# Patient Record
Sex: Female | Born: 1995 | Hispanic: Yes | Marital: Single | Smoking: Never smoker
Health system: Southern US, Community
[De-identification: ages and names within clinical notes are randomized; demographics above are authoritative.]

## PROBLEM LIST (undated history)

## (undated) DIAGNOSIS — D6861 Antiphospholipid syndrome: Secondary | ICD-10-CM

## (undated) DIAGNOSIS — Z789 Other specified health status: Secondary | ICD-10-CM

## (undated) HISTORY — PX: NO PAST SURGERIES: SHX2092

---

## 2016-10-01 ENCOUNTER — Emergency Department (HOSPITAL_BASED_OUTPATIENT_CLINIC_OR_DEPARTMENT_OTHER): Payer: Medicaid Other

## 2016-10-01 ENCOUNTER — Emergency Department (HOSPITAL_BASED_OUTPATIENT_CLINIC_OR_DEPARTMENT_OTHER)
Admission: EM | Admit: 2016-10-01 | Discharge: 2016-10-01 | Disposition: A | Discharge status: Home / Self Care | Payer: Medicaid Other | Attending: Emergency Medicine | Admitting: Emergency Medicine

## 2016-10-01 ENCOUNTER — Encounter (HOSPITAL_BASED_OUTPATIENT_CLINIC_OR_DEPARTMENT_OTHER): Payer: Self-pay | Admitting: Emergency Medicine

## 2016-10-01 DIAGNOSIS — Z79899 Other long term (current) drug therapy: Secondary | ICD-10-CM | POA: Insufficient documentation

## 2016-10-01 DIAGNOSIS — R102 Pelvic and perineal pain: Secondary | ICD-10-CM | POA: Diagnosis not present

## 2016-10-01 DIAGNOSIS — O4690 Antepartum hemorrhage, unspecified, unspecified trimester: Secondary | ICD-10-CM

## 2016-10-01 DIAGNOSIS — Z3A01 Less than 8 weeks gestation of pregnancy: Secondary | ICD-10-CM

## 2016-10-01 DIAGNOSIS — O209 Hemorrhage in early pregnancy, unspecified: Secondary | ICD-10-CM | POA: Insufficient documentation

## 2016-10-01 LAB — CBC WITH DIFFERENTIAL/PLATELET
Basophils Absolute: 0 10*3/uL (ref 0.0–0.1)
Basophils Relative: 0 %
Eosinophils Absolute: 0 10*3/uL (ref 0.0–0.7)
Eosinophils Relative: 0 %
HEMATOCRIT: 37 % (ref 36.0–46.0)
Hemoglobin: 12.1 g/dL (ref 12.0–15.0)
LYMPHS PCT: 28 %
Lymphs Abs: 2 10*3/uL (ref 0.7–4.0)
MCH: 27.6 pg (ref 26.0–34.0)
MCHC: 32.7 g/dL (ref 30.0–36.0)
MCV: 84.5 fL (ref 78.0–100.0)
MONO ABS: 0.6 10*3/uL (ref 0.1–1.0)
MONOS PCT: 9 %
NEUTROS ABS: 4.3 10*3/uL (ref 1.7–7.7)
Neutrophils Relative %: 63 %
Platelets: 201 10*3/uL (ref 150–400)
RBC: 4.38 MIL/uL (ref 3.87–5.11)
RDW: 12.4 % (ref 11.5–15.5)
WBC: 7 10*3/uL (ref 4.0–10.5)

## 2016-10-01 LAB — HCG, QUANTITATIVE, PREGNANCY: hCG, Beta Chain, Quant, S: 30987 m[IU]/mL — ABNORMAL HIGH (ref <5)

## 2016-10-01 LAB — URINALYSIS, ROUTINE W REFLEX MICROSCOPIC
Bilirubin Urine: NEGATIVE
GLUCOSE, UA: NEGATIVE mg/dL
HGB URINE DIPSTICK: NEGATIVE
Ketones, ur: NEGATIVE mg/dL
Leukocytes, UA: NEGATIVE
Nitrite: NEGATIVE
PH: 6.5 (ref 5.0–8.0)
Protein, ur: NEGATIVE mg/dL
SPECIFIC GRAVITY, URINE: 1.021 (ref 1.005–1.030)

## 2016-10-01 LAB — BASIC METABOLIC PANEL WITH GFR
Anion gap: 8 (ref 5–15)
BUN: 11 mg/dL (ref 6–20)
CO2: 24 mmol/L (ref 22–32)
Calcium: 9.6 mg/dL (ref 8.9–10.3)
Chloride: 103 mmol/L (ref 101–111)
Creatinine, Ser: 0.58 mg/dL (ref 0.44–1.00)
GFR calc Af Amer: >60 mL/min
GFR calc non Af Amer: >60 mL/min
Glucose, Bld: 82 mg/dL (ref 65–99)
Potassium: 3.5 mmol/L (ref 3.5–5.1)
Sodium: 135 mmol/L (ref 135–145)

## 2016-10-01 LAB — WET PREP, GENITAL
Clue Cells Wet Prep HPF POC: NONE SEEN
Sperm: NONE SEEN
Trich, Wet Prep: NONE SEEN
Yeast Wet Prep HPF POC: NONE SEEN

## 2016-10-01 NOTE — ED Triage Notes (Signed)
Pt pregnant [redacted] weeks and having vaginal spotting.  States intermittent mild abdominal pains.  Pt notices bleeding only when wiping after urination and states it's been worsening slightly every day.  First noticed 4 days ago.

## 2016-10-01 NOTE — ED Notes (Signed)
Pt given d/c instructions per Dr in [place]. Verbalizes understanding. No questions.

## 2016-10-01 NOTE — Discharge Instructions (Signed)
Ultrasound results: IMPRESSION:  Single living IUP measuring 6 weeks 1 day with US EDC of 05/26/2017.     Tiny subchorionic hemorrhage or implantation bleed noted.   Discuss this with your OB doctor for follow up.

## 2016-10-01 NOTE — ED Notes (Signed)
Patient transported to Ultrasound 

## 2016-10-01 NOTE — ED Notes (Addendum)
Patient P1G0, with light vaginal bleeding x4 days when she urinates. Patient states at the same time that she started having the bleeding she also started having intermittent pelvic cramping. Patient states the pain and the bleeding started at the same time. Patient is A&Ox4, NAD noted. Patient denies any PMH. Patient has not yet been seen by OB-GYN, states her first appointment is on Nov 03, 2016.

## 2016-10-01 NOTE — ED Provider Notes (Signed)
[facility] Provider Note   CSN: [id number] Arrival date & time: 10/01/16  1428  By signing my name below, I, Terri Rice, attest that this documentation has been prepared under the direction and in the presence of Terri ConnPedro Eduardo Cardama, MD. Electronically Signed: Doreatha MartinEva Rice, ED Scribe. 10/01/16. 4:12 PM.     History   Chief Complaint Chief Complaint  Patient presents with  . Vaginal Bleeding    [redacted] weeks pregnant    HPI Terri Rice is a 20 y.o. female G1P0000 5w who presents to the Emergency Department complaining of worsening, light vaginal discharge x 4 days with associated intermittent lower abdominal cramping that lasts 3-4 minutes every 3-4 hours. Pt states she is currently pain-free. Pt describes the discharge as light brown and reports she feels the discharge every time she voids. LMP 08/27/16. She had confirmed urine pregnancy test 4 days ago at home and [facility]. She denies fever, nausea, vomiting, CP, SOB, dysuria, diarrhea, purulent discharge. No h/o STD.   The history is provided by the patient. No language interpreter was used.    History reviewed. No pertinent past medical history.  There are no active problems to display for this patient.   History reviewed. No pertinent surgical history.  OB History    Gravida Para Term Preterm AB Living   1             SAB TAB Ectopic Multiple Live Births                   Home Medications    Prior to Admission medications   Medication Sig Start Date End Date Taking? Authorizing Provider  Prenatal Vit-Fe Fumarate-FA (PRENATAL MULTIVITAMIN) TABS tablet Take 1 tablet by mouth daily at 12 noon.   Yes Historical Provider, MD    Family History No family history on file.  Social History Social History  Substance Use Topics  . Smoking status: Never Smoker  . Smokeless tobacco: Never Used  . Alcohol use Not on file     Allergies   Patient has no known allergies.   Review of  Systems Review of Systems A complete 10 system review of systems was obtained and all systems are negative except as noted in the HPI and PMH.    Physical Exam Updated Vital Signs BP 118/80 (BP Location: Right Arm)   Pulse 88   Temp 98.4 F (36.9 C) (Oral)   Resp 18   Ht 5\' 3"  (1.6 m)   SpO2 100%   Physical Exam  Constitutional: She is oriented to person, place, and time. She appears well-developed and well-nourished. No distress.  HENT:  Head: Normocephalic and atraumatic.  Nose: Nose normal.  Eyes: Conjunctivae and EOM are normal. Pupils are equal, round, and reactive to light. Right eye exhibits no discharge. Left eye exhibits no discharge. No scleral icterus.  Neck: Normal range of motion. Neck supple.  Cardiovascular: Normal rate, regular rhythm and normal heart sounds.  Exam reveals no gallop and no friction rub.   No murmur heard. Pulmonary/Chest: Effort normal and breath sounds normal. No stridor. No respiratory distress. She has no wheezes. She has no rales.  Abdominal: Soft. Bowel sounds are normal. She exhibits no distension. There is no tenderness.  Genitourinary: Rectal exam shows no external hemorrhoid. Pelvic exam was performed with patient supine. Uterus is enlarged. Uterus is not deviated and not tender. Cervix exhibits no motion tenderness, no discharge and no friability. Right adnexum  displays no mass and no tenderness. Left adnexum displays no mass and no tenderness. No erythema, tenderness or bleeding in the vagina. No foreign body in the vagina. No signs of injury around the vagina. Vaginal discharge (thick brownish-white) found.  Genitourinary Comments: Engorged cervix. Chaperone present throughout entire exam.   Musculoskeletal: She exhibits no edema or tenderness.  Neurological: She is alert and oriented to person, place, and time.  Skin: Skin is warm and dry. No rash noted. She is not diaphoretic. No erythema.  Psychiatric: She has a normal mood and affect.    Vitals reviewed.    ED Treatments / Results   DIAGNOSTIC STUDIES: Oxygen Saturation is 100% on RA, normal by my interpretation.    COORDINATION OF CARE: 4:10 PM Discussed treatment plan with pt at bedside which includes UA, urine preg and pt agreed to plan.    Labs (all labs ordered are listed, but only abnormal results are displayed) Labs Reviewed  WET PREP, GENITAL - Abnormal; Notable for the following:       Result Value   WBC, Wet Prep HPF POC MODERATE (*)    All other components within normal limits  HCG, QUANTITATIVE, PREGNANCY - Abnormal; Notable for the following:    hCG, Beta Chain, Quant, S 30,987 (*)    All other components within normal limits  URINALYSIS, ROUTINE W REFLEX MICROSCOPIC (NOT AT [facility])  CBC WITH DIFFERENTIAL/PLATELET  BASIC METABOLIC PANEL  ABO/RH  GC/CHLAMYDIA PROBE AMP ([place]) NOT AT [facility]    EKG  EKG Interpretation None       Radiology [place] Ob Comp < 14 Wks  Result Date: 10/01/2016 CLINICAL DATA:  Pelvic pain cramping and vaginal bleeding for 4 days. Gestational age by LMP of 5 weeks 0 days. EXAM: OBSTETRIC <14 WK US AND TRANSVAGINAL OB US TECHNIQUE: Both transabdominal and transvaginal ultrasound examinations were performed for complete evaluation of the gestation as well as the maternal uterus, adnexal regions, and pelvic cul-de-sac. Transvaginal technique was performed to assess early pregnancy. COMPARISON:  None. FINDINGS: Intrauterine gestational sac: Single Yolk sac:  Visualized. Embryo:  Visualized. Cardiac Activity: Visualized. Heart Rate: 114  bpm CRL:  4  mm   6 w   1 d                  US EDC: 05/26/2017 Subchorionic hemorrhage: Tiny subchorionic hemorrhage or implantation bleed noted. Maternal uterus/adnexae: Retroverted uterus. Small left ovarian corpus luteum noted. Otherwise normal appearance of both ovaries. No masses or abnormal free fluid identified. IMPRESSION: Single living IUP measuring 6 weeks 1 day with US EDC of  05/26/2017. Tiny subchorionic hemorrhage or implantation bleed noted. Electronically Signed   By: Myles RosenthalJohn  Stahl M.D.   On: 10/01/2016 18:07   [place] Ob Transvaginal  Result Date: 10/01/2016 CLINICAL DATA:  Pelvic pain cramping and vaginal bleeding for 4 days. Gestational age by LMP of 5 weeks 0 days. EXAM: OBSTETRIC <14 WK US AND TRANSVAGINAL OB US TECHNIQUE: Both transabdominal and transvaginal ultrasound examinations were performed for complete evaluation of the gestation as well as the maternal uterus, adnexal regions, and pelvic cul-de-sac. Transvaginal technique was performed to assess early pregnancy. COMPARISON:  None. FINDINGS: Intrauterine gestational sac: Single Yolk sac:  Visualized. Embryo:  Visualized. Cardiac Activity: Visualized. Heart Rate: 114  bpm CRL:  4  mm   6 w   1 d                  US EDC: 05/26/2017 Subchorionic  hemorrhage: Tiny subchorionic hemorrhage or implantation bleed noted. Maternal uterus/adnexae: Retroverted uterus. Small left ovarian corpus luteum noted. Otherwise normal appearance of both ovaries. No masses or abnormal free fluid identified. IMPRESSION: Single living IUP measuring 6 weeks 1 day with [place] EDC of 05/26/2017. Tiny subchorionic hemorrhage or implantation bleed noted. Electronically Signed   By: Myles Rosenthal M.D.   On: 10/01/2016 18:07    Procedures Procedures (including critical care time)  Medications Ordered in ED Medications - No data to display   Initial Impression / Assessment and Plan / ED Course  I have reviewed the triage vital signs and the nursing notes.  Pertinent labs & imaging results that were available during my care of the patient were reviewed by me and considered in my medical decision making (see chart for details).  Clinical Course as of Oct 01 1816  Wynelle Link Oct 01, 2016  1812 Rh+. No evidence of cervicitis. No trich or BV. No UTI. [place] with IUP at 6w 1d. Implantation bleed vs subchorionic hemorrahge. Pt made aware of findings. OB follow  up.  The patient is safe for discharge with strict return precautions.   [PC]    Clinical Course User Index [PC] Terri Conn, MD      Final Clinical Impressions(s) / ED Diagnoses   Final diagnoses:  Vaginal bleeding in pregnancy  Less than [redacted] weeks gestation of pregnancy   Disposition: Discharge  Condition: Good  I have discussed the results, Dx and Tx plan with the patient who expressed understanding and agree(s) with the plan. Discharge instructions discussed at great length. The patient was given strict return precautions who verbalized understanding of the instructions. No further questions at time of discharge.    Current Discharge Medication List      Follow Up: Obstetrician  Schedule an appointment as soon as possible for a visit    I personally performed the services described in this documentation, which was scribed in my presence. The recorded information has been reviewed and is accurate.         Terri Conn, MD 10/01/16 [phone]

## 2016-10-01 NOTE — ED Notes (Signed)
US notified patient is ready for imaging

## 2016-10-01 NOTE — ED Notes (Signed)
Pelvic Cart at bedside 

## 2016-10-02 LAB — ABO/RH: ABO/RH(D): O POS

## 2016-10-03 LAB — GC/CHLAMYDIA PROBE AMP (~~LOC~~) NOT AT ARMC
Chlamydia: NEGATIVE
NEISSERIA GONORRHEA: NEGATIVE

## 2017-02-04 ENCOUNTER — Encounter (HOSPITAL_BASED_OUTPATIENT_CLINIC_OR_DEPARTMENT_OTHER): Payer: Self-pay | Admitting: *Deleted

## 2017-02-04 ENCOUNTER — Emergency Department (HOSPITAL_BASED_OUTPATIENT_CLINIC_OR_DEPARTMENT_OTHER)
Admission: EM | Admit: 2017-02-04 | Discharge: 2017-02-04 | Disposition: A | Discharge status: Home / Self Care | Payer: Medicaid Other | Attending: Emergency Medicine | Admitting: Emergency Medicine

## 2017-02-04 DIAGNOSIS — Z79899 Other long term (current) drug therapy: Secondary | ICD-10-CM | POA: Insufficient documentation

## 2017-02-04 DIAGNOSIS — O2692 Pregnancy related conditions, unspecified, second trimester: Secondary | ICD-10-CM

## 2017-02-04 DIAGNOSIS — E860 Dehydration: Secondary | ICD-10-CM | POA: Diagnosis not present

## 2017-02-04 DIAGNOSIS — R42 Dizziness and giddiness: Secondary | ICD-10-CM

## 2017-02-04 DIAGNOSIS — O99282 Endocrine, nutritional and metabolic diseases complicating pregnancy, second trimester: Secondary | ICD-10-CM | POA: Diagnosis not present

## 2017-02-04 DIAGNOSIS — R51 Headache: Secondary | ICD-10-CM | POA: Insufficient documentation

## 2017-02-04 DIAGNOSIS — O26892 Other specified pregnancy related conditions, second trimester: Secondary | ICD-10-CM | POA: Diagnosis present

## 2017-02-04 DIAGNOSIS — R079 Chest pain, unspecified: Secondary | ICD-10-CM | POA: Diagnosis not present

## 2017-02-04 DIAGNOSIS — R002 Palpitations: Secondary | ICD-10-CM | POA: Insufficient documentation

## 2017-02-04 DIAGNOSIS — Z3A24 24 weeks gestation of pregnancy: Secondary | ICD-10-CM | POA: Insufficient documentation

## 2017-02-04 LAB — CBC WITH DIFFERENTIAL/PLATELET
Basophils Absolute: 0 10*3/uL (ref 0.0–0.1)
Basophils Relative: 0 %
Eosinophils Absolute: 0 10*3/uL (ref 0.0–0.7)
Eosinophils Relative: 0 %
HEMATOCRIT: 30.9 % — AB (ref 36.0–46.0)
Hemoglobin: 10.1 g/dL — ABNORMAL LOW (ref 12.0–15.0)
LYMPHS ABS: 1.3 10*3/uL (ref 0.7–4.0)
LYMPHS PCT: 11 %
MCH: 28.1 pg (ref 26.0–34.0)
MCHC: 32.7 g/dL (ref 30.0–36.0)
MCV: 86.1 fL (ref 78.0–100.0)
MONOS PCT: 6 %
Monocytes Absolute: 0.7 10*3/uL (ref 0.1–1.0)
NEUTROS ABS: 9.3 10*3/uL — AB (ref 1.7–7.7)
Neutrophils Relative %: 83 %
Platelets: 166 10*3/uL (ref 150–400)
RBC: 3.59 MIL/uL — ABNORMAL LOW (ref 3.87–5.11)
RDW: 12.8 % (ref 11.5–15.5)
WBC: 11.3 10*3/uL — ABNORMAL HIGH (ref 4.0–10.5)

## 2017-02-04 LAB — URINALYSIS, ROUTINE W REFLEX MICROSCOPIC
BILIRUBIN URINE: NEGATIVE
GLUCOSE, UA: NEGATIVE mg/dL
Hgb urine dipstick: NEGATIVE
KETONES UR: NEGATIVE mg/dL
Nitrite: NEGATIVE
PH: 6.5 (ref 5.0–8.0)
PROTEIN: NEGATIVE mg/dL
SPECIFIC GRAVITY, URINE: 1.016 (ref 1.005–1.030)

## 2017-02-04 LAB — URINALYSIS, MICROSCOPIC (REFLEX)

## 2017-02-04 LAB — BASIC METABOLIC PANEL
ANION GAP: 8 (ref 5–15)
BUN: 6 mg/dL (ref 6–20)
CHLORIDE: 103 mmol/L (ref 101–111)
CO2: 22 mmol/L (ref 22–32)
Calcium: 9.2 mg/dL (ref 8.9–10.3)
Creatinine, Ser: 0.49 mg/dL (ref 0.44–1.00)
GFR calc Af Amer: >60 mL/min (ref >60)
GFR calc non Af Amer: >60 mL/min (ref >60)
GLUCOSE: 81 mg/dL (ref 65–99)
POTASSIUM: 3.7 mmol/L (ref 3.5–5.1)
Sodium: 133 mmol/L — ABNORMAL LOW (ref 135–145)

## 2017-02-04 MED ORDER — SODIUM CHLORIDE 0.9 % IV BOLUS (SEPSIS)
1000.0000 mL | Freq: Once | INTRAVENOUS | Status: AC
  Administered 2017-02-04: 1000 mL via INTRAVENOUS

## 2017-02-04 NOTE — Progress Notes (Signed)
Spoke with Dr. Despina Hidden. Pt is a G1P0 at 24 4/[redacted] weeks gestation presenting with c/o palpatations and dizziness. No vaginal bleeding or leaking of fluid. FHR 145 BPM, mod variability, accels, no decels. No uc's. Dr. Despina Hidden notified of orthostatic blood pressures, labs, and u/a. Pt is getting a liter of IVF. Pt gets her care in [place]. Pt is OB cleared and the [facility] staff is to call the pt's OB for any further concerns.

## 2017-02-04 NOTE — ED Triage Notes (Signed)
Pt reports being approx [redacted] weeks pregnant and has been experiencing dizziness (mostly when going from sitting to standing). Also reports feeling tachycardic at times. Denies fever, abd pain, bleeding/fluid from vagina. Pt reports receiving care in [facility]. Denies any symptoms of dizziness/sob at this time.

## 2017-02-04 NOTE — Progress Notes (Signed)
Spoke with Maury Dus. Pt is OB cleared and they are to call her OB with any further concerns. The pt can be taken off the FM.

## 2017-02-04 NOTE — ED Provider Notes (Signed)
[facility] Provider Note   CSN: [id number] Arrival date & time: 02/04/17  1510  By signing my name below, I, Bing Neighbors., attest that this documentation has been prepared under the direction and in the presence of Rolan Bucco, MD. Electronically signed: Bing Neighbors., ED Scribe. 02/04/17. 4:55 PM.   History   Chief Complaint Chief Complaint  Patient presents with  . Dizziness    HPI  Terri Rice is a 21 y.o. female with no significant medical hx who presents to the Emergency Department complaining of dizziness with onset x1 day. Of note, pt is x24 weeks pregnant. Pt states that while at church earlier today, she stood and experienced an episode dizziness with associated chest pain, palpitations and SOB which lasted x5 minutes. Pt states that the symptoms have since resolved. She denies any modifying factors. Pt denies fever, abdominal pain, vaginal bleeding/discharge, leg pain/swelling, appetite change, difficulty urinating, cough, congestion. She denies any pregnancy complications.   The history is provided by the patient and a relative. No language interpreter was used.    History reviewed. No pertinent past medical history.  There are no active problems to display for this patient.   History reviewed. No pertinent surgical history.  OB History    Gravida Para Term Preterm AB Living   1             SAB TAB Ectopic Multiple Live Births                   Home Medications    Prior to Admission medications   Medication Sig Start Date End Date Taking? Authorizing Provider  folic acid (FOLVITE) 1 MG tablet Take 1 mg by mouth daily.   Yes Historical Provider, MD  Prenatal Vit-Fe Fumarate-FA (PRENATAL MULTIVITAMIN) TABS tablet Take 1 tablet by mouth daily at 12 noon.   Yes Historical Provider, MD  UNKNOWN TO PATIENT    Yes Historical Provider, MD    Family History No family history on file.  Social History Social  History  Substance Use Topics  . Smoking status: Never Smoker  . Smokeless tobacco: Never Used  . Alcohol use No     Allergies   Patient has no known allergies.   Review of Systems Review of Systems  Constitutional: Negative for chills, diaphoresis, fatigue and fever.  HENT: Negative for congestion, rhinorrhea and sneezing.   Eyes: Negative.   Respiratory: Negative for cough, chest tightness and shortness of breath.   Cardiovascular: Positive for chest pain and palpitations. Negative for leg swelling.  Gastrointestinal: Negative for abdominal pain, blood in stool, diarrhea, nausea and vomiting.  Genitourinary: Negative for difficulty urinating, flank pain, frequency and hematuria.  Musculoskeletal: Negative for arthralgias and back pain.  Skin: Negative for rash.  Neurological: Positive for dizziness and headaches. Negative for speech difficulty, weakness and numbness.     Physical Exam Updated Vital Signs BP 115/68 (BP Location: Right Arm)   Pulse 92   Temp 98.4 F (36.9 C) (Oral)   Resp 18   Ht  (1.626 m)   Wt 132 lb 3.2 oz (60 kg)   SpO2 100%   BMI 22.69 kg/m   Physical Exam  Constitutional: She is oriented to person, place, and time. She appears well-developed and well-nourished.  HENT:  Head: Normocephalic and atraumatic.  Eyes: Pupils are equal, round, and reactive to light.  Neck: Normal range of motion. Neck supple.  Cardiovascular: Normal rate, regular rhythm  and normal heart sounds.   Pulmonary/Chest: Effort normal and breath sounds normal. No respiratory distress. She has no wheezes. She has no rales. She exhibits no tenderness.  Abdominal: Soft. Bowel sounds are normal. There is no tenderness. There is no rebound and no guarding.  Fundus is just above the umbilicus.   Musculoskeletal: Normal range of motion. She exhibits no edema.       Right lower leg: She exhibits no tenderness and no edema.       Left lower leg: She exhibits no tenderness and no  edema.  No calf tenderness or edema.   Lymphadenopathy:    She has no cervical adenopathy.  Neurological: She is alert and oriented to person, place, and time.  Motor 5/5 all extremities Sensation grossly intact to LT all extremities Finger to Nose intact, no pronator drift CN II-XII grossly intact Gait normal   Skin: Skin is warm and dry. No rash noted.  Psychiatric: She has a normal mood and affect.     ED Treatments / Results   DIAGNOSTIC STUDIES: Oxygen Saturation is 100% on RA, normal by my interpretation.   COORDINATION OF CARE: 4:55 PM-Discussed next steps with pt. Pt verbalized understanding and is agreeable with the plan.    Labs (all labs ordered are listed, but only abnormal results are displayed) Labs Reviewed  BASIC METABOLIC PANEL - Abnormal; Notable for the following:       Result Value   Sodium 133 (*)    All other components within normal limits  CBC WITH DIFFERENTIAL/PLATELET - Abnormal; Notable for the following:    WBC 11.3 (*)    RBC 3.59 (*)    Hemoglobin 10.1 (*)    HCT 30.9 (*)    Neutro Abs 9.3 (*)    All other components within normal limits  URINALYSIS, ROUTINE W REFLEX MICROSCOPIC - Abnormal; Notable for the following:    APPearance CLOUDY (*)    Leukocytes, UA SMALL (*)    All other components within normal limits  URINALYSIS, MICROSCOPIC (REFLEX) - Abnormal; Notable for the following:    Bacteria, UA FEW (*)    Squamous Epithelial / LPF 0-5 (*)    All other components within normal limits  URINE CULTURE    EKG  EKG Interpretation  Date/Time:  Sunday February 04 2017 15:39:00 EDT Ventricular Rate:  100 PR Interval:    QRS Duration: 82 QT Interval:  328 QTC Calculation: 423 R Axis:   74 Text Interpretation:  Sinus tachycardia Biatrial enlargement RSR' in V1 or V2, probably normal variant Baseline wander in lead(s) V3 V5 V6 No old tracing to compare Confirmed by Renesha Lizama  MD, Nyala Kirchner ([id number]) on 02/04/2017 3:43:18 PM        Radiology No results found.  Procedures Procedures (including critical care time)  Medications Ordered in ED Medications  sodium chloride 0.9 % bolus 1,000 mL (1,000 mLs Intravenous New Bag/Given 02/04/17 1554)     Initial Impression / Assessment and Plan / ED Course  I have reviewed the triage vital signs and the nursing notes.  Pertinent labs & imaging results that were available during my care of the patient were reviewed by me and considered in my medical decision making (see chart for details).     Patient is [redacted] weeks pregnant and presents after she had an episode of palpitations and dizziness while she was standing up for a prolonged period in church. Her symptoms have since resolved. She currently has no chest pain palpitations or  shortness of breath. Her EKG showed some mild tachycardia, sinus rhythm. No dysrhythmias were noted. She's had no arrhythmias in the ED. Her labs are non-concerning. She is able to ambulate without symptoms. She was given IV fluids in the ED. She was placed on the fetal monitor and was cleared by the rapid response. She was encouraged to have close follow-up within the next 1-2 days with her OB/GYN. Return precautions were given.  Final Clinical Impressions(s) / ED Diagnoses   Final diagnoses:  Dizziness  Dehydration  Pregnancy related condition in second trimester    New Prescriptions New Prescriptions   No medications on file   I personally performed the services described in this documentation, which was scribed in my presence.  The recorded information has been reviewed and considered.     Rolan Bucco, MD 02/04/17 [phone]

## 2017-02-04 NOTE — ED Notes (Signed)
Spoke with Hazel Sams, RR RN. She spoke with Dr. Despina Hidden and they are signing off. "Tracing looks great. No contractions. If we have further problems, contact pt's OB/GYN in [place]."

## 2017-02-04 NOTE — ED Notes (Signed)
Pt is [redacted] weeks pregnant. G1P0. Has been having intermittent episodes of dizziness and feeling like her heart is racing for about 3 months. No other s/s. No vaginal bleeding or d/c. Placed on fetal monitror and RR RN  Corrie Dandy Early) notified. EKG obtained and pt placed on cardiac monitor HR 80-100 and regular. Dr. Fredderick Phenix at bedside. IV initiated and labs obtained.

## 2017-02-04 NOTE — Progress Notes (Signed)
Pt is a G1P0 at 24 4/7 weeks gestaion with c/o palpatations and dizziness. Pt gets regular PNC in [place]. Denies vaginal bleeding or leaking of fluid.

## 2017-02-04 NOTE — ED Notes (Signed)
Denys any dizziness when ambulating. Dr at bedside.

## 2017-02-06 LAB — URINE CULTURE: Culture: >=100000 — AB

## 2017-02-07 ENCOUNTER — Telehealth: Payer: Self-pay | Admitting: Emergency Medicine

## 2017-02-07 NOTE — Telephone Encounter (Signed)
Post ED Visit - Positive Culture Follow-up  Culture report reviewed by antimicrobial stewardship pharmacist:   Enzo Bi, Pharm.D.  Celedonio Miyamoto, Pharm.D., BCPS AQ-ID  Garvin Fila, Pharm.D., BCPS  Georgina Pillion, Pharm.D., BCPS  [place], [place].D., BCPS, AAHIVP  Estella Husk, Pharm.D., BCPS, AAHIVP  Lysle Pearl, PharmD, BCPS  Casilda Carls, PharmD, BCPS  Pollyann Samples, PharmD, BCPS Dietrich Pates PharmD  Positive urine culture Treated with none, asymptomatic, likely contaminant, no further patient follow-up is required at this time.  Berle Mull 02/07/2017, 12:47 PM

## 2017-03-12 IMAGING — US US OB COMP LESS 14 WK
1 series · 14 of 28 positions shown · non-contrast
Comparison: None.

CLINICAL DATA: Pelvic pain cramping and vaginal bleeding for 4
days. Gestational age by LMP of 5 weeks 0 days.

EXAM:
OBSTETRIC <14 WK US AND TRANSVAGINAL OB US
TECHNIQUE: Both transabdominal and transvaginal ultrasound examinations were
performed for complete evaluation of the gestation as well as the
maternal uterus, adnexal regions, and pelvic cul-de-sac.
Transvaginal technique was performed to assess early pregnancy.

[Series 1: us ob comp less 14 wk · 0.21mm/px · 14 of 88 slices shown]
[im 4/88]
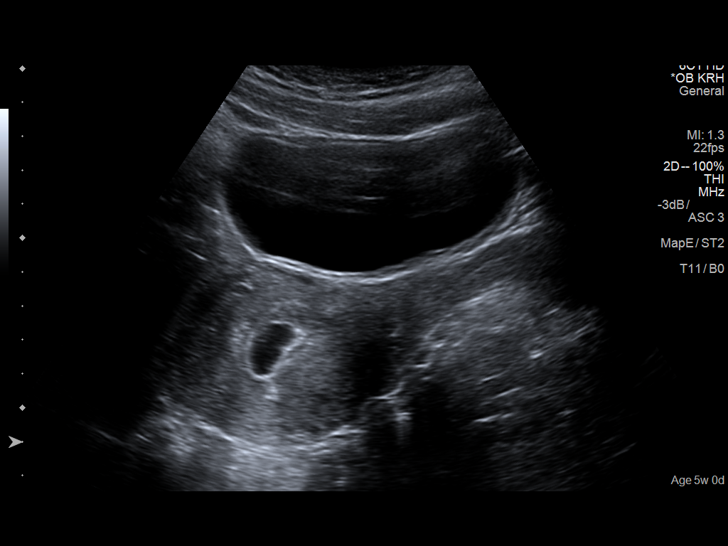
[im 10/88]
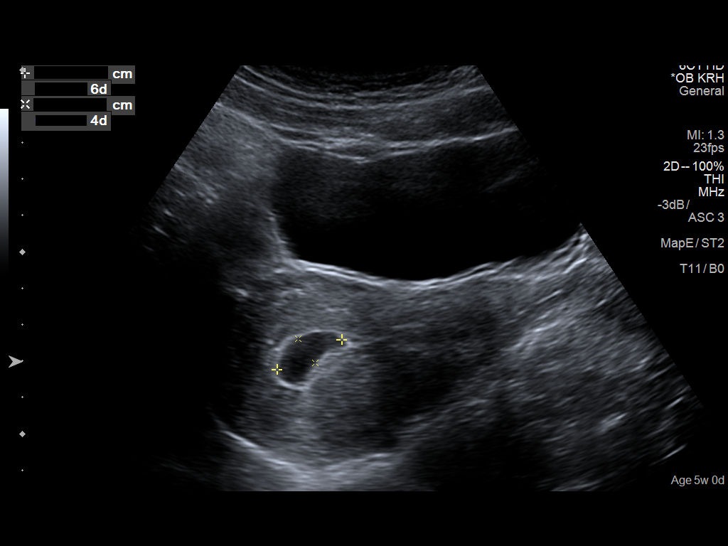
[im 17/88]
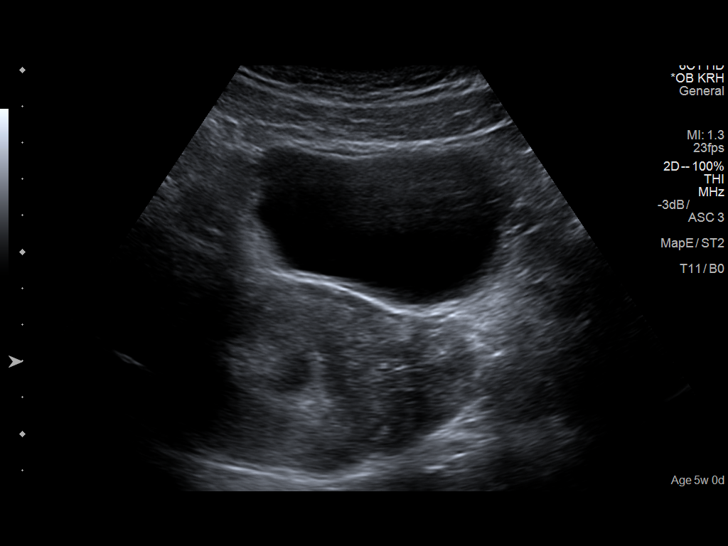
[im 23/88]
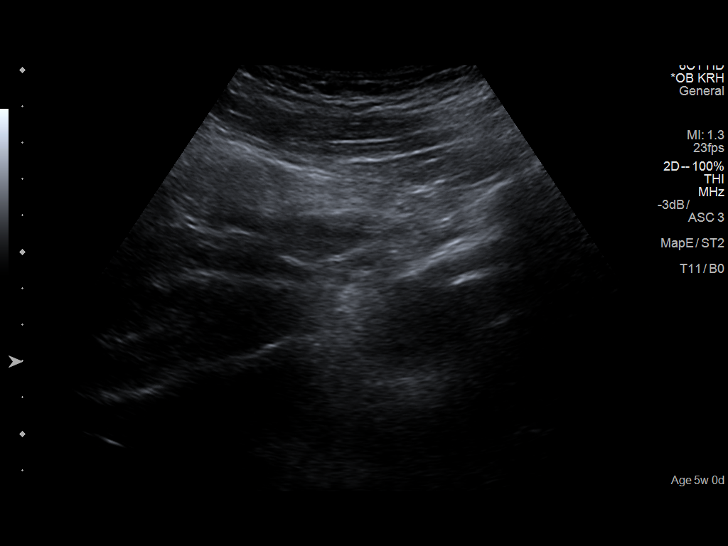
[im 30/88]
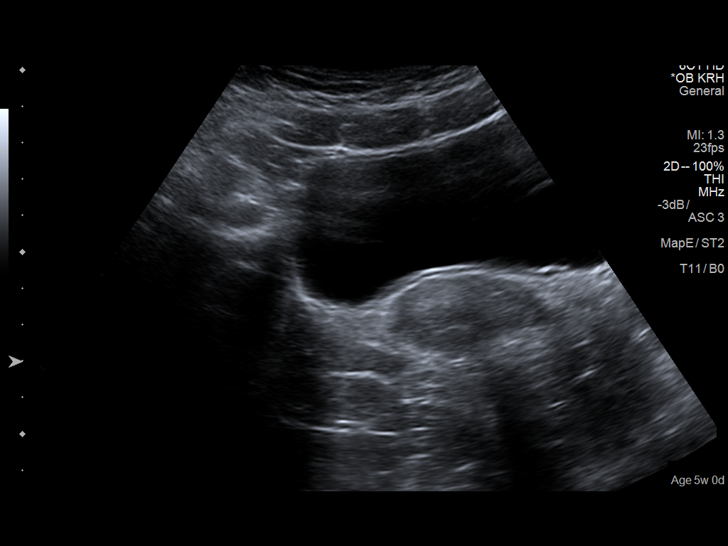
[im 36/88]
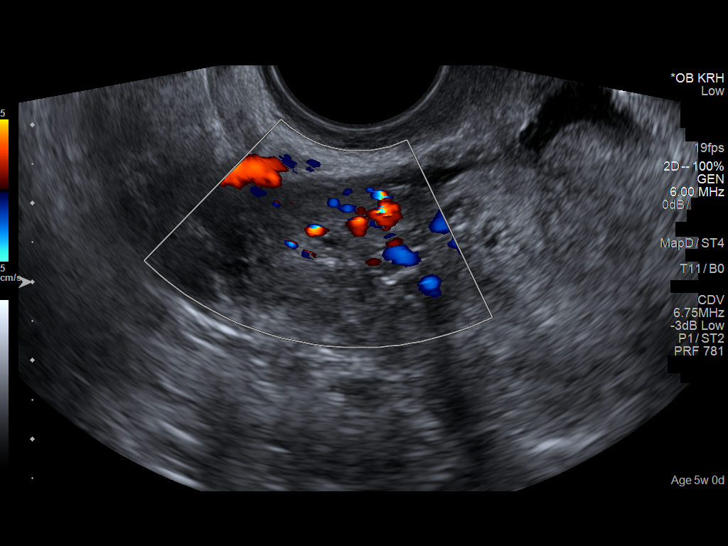
[im 42/88]
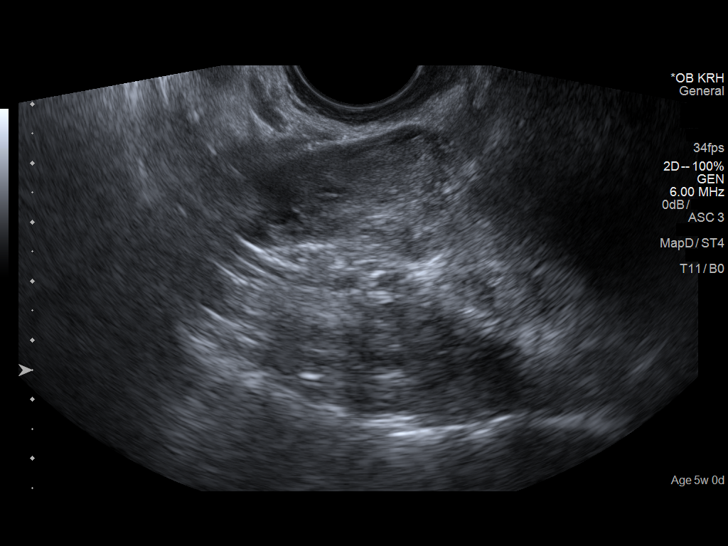
[im 49/88]
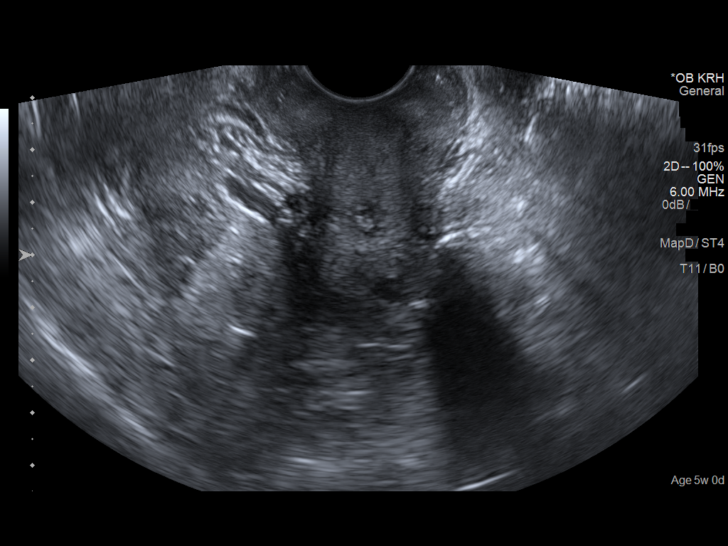
[im 55/88]
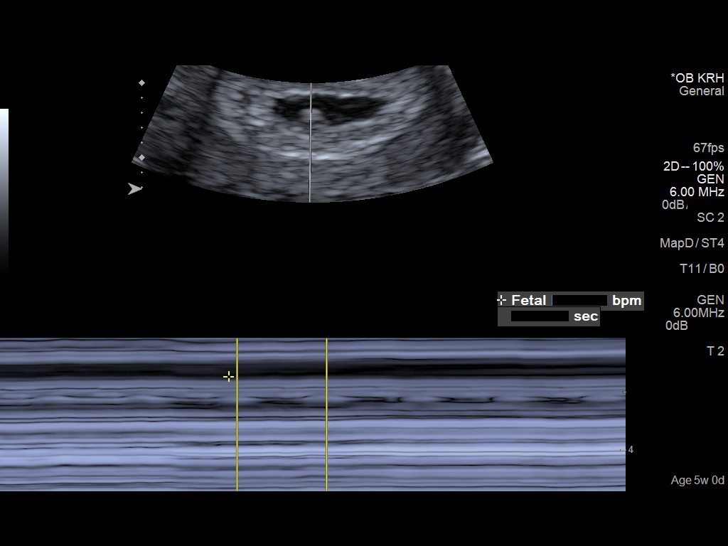
[im 62/88]
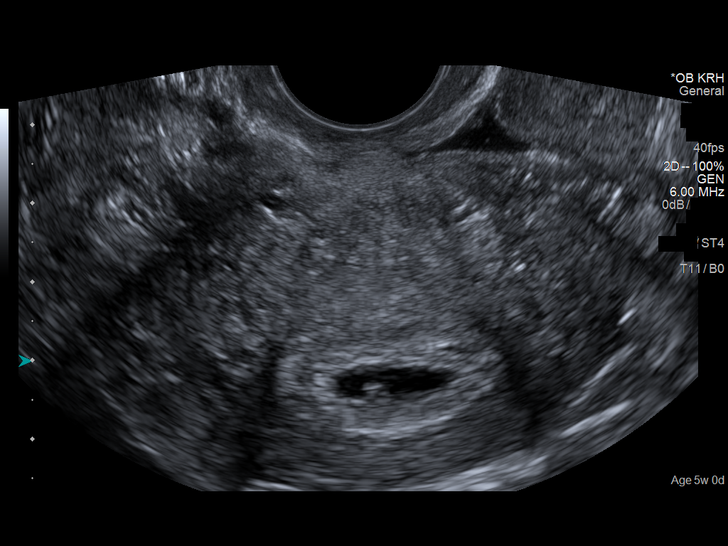
[im 68/88]
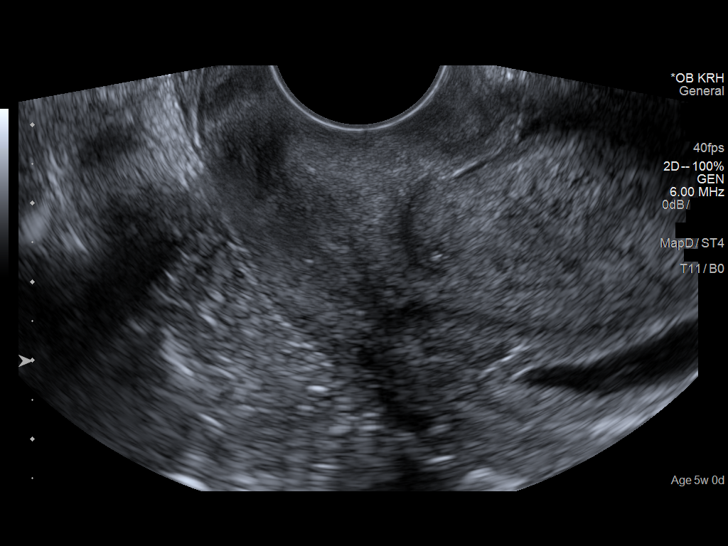
[im 75/88]
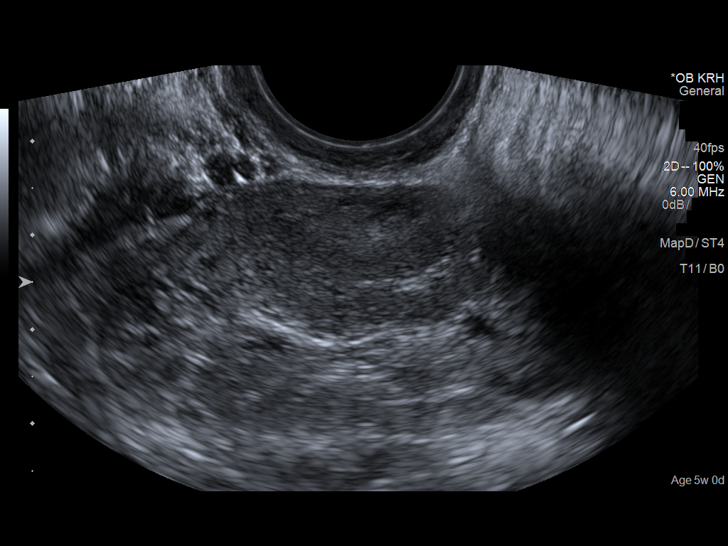
[im 81/88]
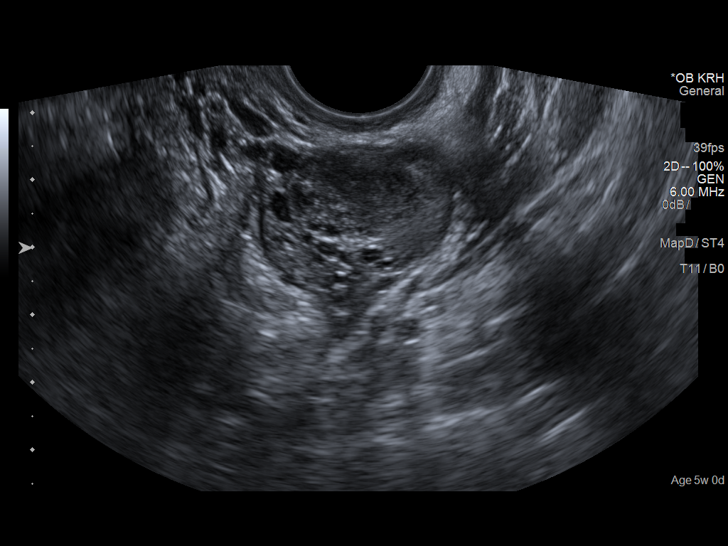
[im 88/88]
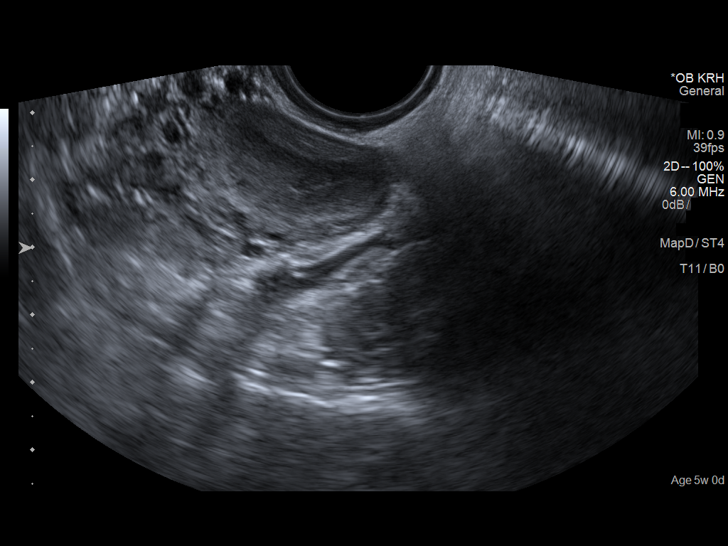

[14 of 28 positions shown; findings below may reference images not displayed]

FINDINGS: Intrauterine gestational sac: Single

Yolk sac:  Visualized.

Embryo:  Visualized.

Cardiac Activity: Visualized.

Heart Rate: 114  bpm

CRL:  4  mm   6 w   1 d                  US EDC: 05/26/2017

Subchorionic hemorrhage: Tiny subchorionic hemorrhage or
implantation bleed noted.

Maternal uterus/adnexae: Retroverted uterus. Small left ovarian
corpus luteum noted. Otherwise normal appearance of both ovaries. No
masses or abnormal free fluid identified.
IMPRESSION: Single living IUP measuring 6 weeks 1 day with US EDC of 05/26/2017.

Tiny subchorionic hemorrhage or implantation bleed noted.

## 2020-03-28 ENCOUNTER — Other Ambulatory Visit: Payer: Self-pay

## 2020-03-28 ENCOUNTER — Inpatient Hospital Stay (HOSPITAL_COMMUNITY)
Admission: AD | Admit: 2020-03-28 | Discharge: 2020-03-28 | Disposition: A | Discharge status: Home / Self Care | Payer: Medicaid Other | Attending: Obstetrics and Gynecology | Admitting: Obstetrics and Gynecology

## 2020-03-28 ENCOUNTER — Encounter (HOSPITAL_COMMUNITY): Payer: Self-pay | Admitting: Emergency Medicine

## 2020-03-28 DIAGNOSIS — O039 Complete or unspecified spontaneous abortion without complication: Secondary | ICD-10-CM | POA: Diagnosis present

## 2020-03-28 DIAGNOSIS — O021 Missed abortion: Secondary | ICD-10-CM | POA: Diagnosis not present

## 2020-03-28 DIAGNOSIS — Z3A15 15 weeks gestation of pregnancy: Secondary | ICD-10-CM

## 2020-03-28 HISTORY — DX: Other specified health status: Z78.9

## 2020-03-28 LAB — WET PREP, GENITAL
Clue Cells Wet Prep HPF POC: NONE SEEN
Sperm: NONE SEEN
Trich, Wet Prep: NONE SEEN
Yeast Wet Prep HPF POC: NONE SEEN

## 2020-03-28 NOTE — ED Triage Notes (Signed)
Pt states she was [redacted] weeks pregnant and went to GYN appt and unable to find fetal heartbeat.  Went for [place] and was told that fetus stopped growing at 13 weeks.  States she was told to come to hospital if she started feeling bad.  Reports lower abd pain and vaginal discharge.

## 2020-03-28 NOTE — ED Provider Notes (Signed)
MSE was initiated and I personally evaluated the patient and placed orders (if any) at  3:44 PM on Mar 28, 2020.  24 year old female presents to the ED due to bilateral lower abdominal cramping associated with malodorous yellow discharge. Denies vaginal bleeding. Patient was [redacted] weeks pregnant and was told there was no fetal heart beat on 5/27. No intervention after miscarriage. This is patient's 2nd pregnancy with 1 living child. Her OB is in [place] with [facility]. LMP February 11th.  Abdomen soft, non-distended with mild suprapubic tenderness. No rebound or guarding.   Discussed case with Joni Reining at [facility] who agrees to accept transfer of patient. Patient stable for transfer.   The patient appears stable so that the remainder of the MSE may be completed by another provider.   Mannie Stabile, PA-C 03/28/20 1552    Melene Plan, DO 03/28/20 2148

## 2020-03-28 NOTE — Discharge Instructions (Signed)
Miscarriage A miscarriage is the loss of an unborn baby (fetus) before the 20th week of pregnancy. Follow these instructions at home: Medicines   Take over-the-counter and prescription medicines only as told by your doctor.  If you were prescribed antibiotic medicine, take it as told by your doctor. Do not stop taking the antibiotic even if you start to feel better.  Do not take NSAIDs unless your doctor says that this is safe for you. NSAIDs include aspirin and ibuprofen. These medicines can cause bleeding. Activity  Rest as directed. Ask your doctor what activities are safe for you.  Have someone help you at home during this time. General instructions  Write down how many pads you use each day and how soaked they are.  Watch the amount of tissue or clumps of blood (blood clots) that you pass from your vagina. Save any large amounts of tissue for your doctor.  Do not use tampons, douche, or have sex until your doctor approves.  To help you and your partner with the process of grieving, talk with your doctor or seek counseling.  When you are ready, meet with your doctor to talk about steps you should take for your health. Also, talk with your doctor about steps to take to have a healthy pregnancy in the future.  Keep all follow-up visits as told by your doctor. This is important. Contact a doctor if:  You have a fever or chills.  You have vaginal discharge that smells bad.  You have more bleeding. Get help right away if:  You have very bad cramps or pain in your back or belly.  You pass clumps of blood that are walnut-sized or larger from your vagina.  You pass tissue that is walnut-sized or larger from your vagina.  You soak more than 1 regular pad in an hour.  You get light-headed or weak.  You faint (pass out).  You have feelings of sadness that do not go away, or you have thoughts of hurting yourself. Summary  A miscarriage is the loss of an unborn baby before  the 20th week of pregnancy.  Follow your doctor's instructions for home care. Keep all follow-up appointments.  To help you and your partner with the process of grieving, talk with your doctor or seek counseling. This information is not intended to replace advice given to you by your health care provider. Make sure you discuss any questions you have with your health care provider. Document Revised: 02/07/2019 Document Reviewed: 11/21/2016 Elsevier Patient Education  2020 [employer]  

## 2020-03-28 NOTE — MAU Note (Signed)
Terri Rice is a 24 y.o. at [redacted]w[redacted]d here in MAU reporting: started having abdominal pain and vaginal discharge since yesterday. States discharge is yellow and foul smelling, it is mucus. States she is supposed to have a d&c this week but they have not called to schedule it yet. Also been having some tachycardia and she cant sleep. No bleeding.   Onset of complaint: yesterday  Pain score: 5/10  Vitals:   03/28/20 1515 03/28/20 1613  BP: (!) 129/93 125/71  Pulse: 76 75  Resp: 16 16  Temp: 98 F (36.7 C) 98.6 F (37 C)  SpO2: 100% 100%     Lab orders placed from triage: UA

## 2020-03-28 NOTE — MAU Provider Note (Addendum)
None     Chief Complaint:  Miscarriage   Chrysta Kemari Mares is  24 y.o. G1P0 at [redacted]w[redacted]d presents complaining of Miscarriage 24 year old female presents to the ED due to bilateral lower abdominal cramping associated with malodorous yellow discharge. Denies vaginal bleeding. Patient was [redacted] weeks pregnant and was told there was no fetal heart beat on 5/27, fetus measuring 13 weeks. No intervention after miscarriage, is awaiting call from OB to tell her when her D&C is scheduled. This is patient's 2nd pregnancy with 1 living child. Her OB is in [place] with [facility] LMP February 11th. Records not visible in Care Everywhere  Obstetrical/Gynecological History: OB History    Gravida  2   Para  1   Term  1   Preterm      AB      Living  1     SAB      TAB      Ectopic      Multiple      Live Births  1          Past Medical History: Past Medical History:  Diagnosis Date  . Medical history non-contributory     Past Surgical History: Past Surgical History:  Procedure Laterality Date  . NO PAST SURGERIES      Family History: History reviewed. No pertinent family history.  Social History: Social History   Tobacco Use  . Smoking status: Never Smoker  . Smokeless tobacco: Never Used  Substance Use Topics  . Alcohol use: No  . Drug use: No    Allergies: No Known Allergies  Meds:  Medications Prior to Admission  Medication Sig Dispense Refill Last Dose  . folic acid (FOLVITE) 1 MG tablet Take 1 mg by mouth daily.     . Prenatal Vit-Fe Fumarate-FA (PRENATAL MULTIVITAMIN) TABS tablet Take 1 tablet by mouth daily at 12 noon.     Marland Kitchen UNKNOWN TO PATIENT        Review of Systems   Constitutional: Negative for fever and chills Eyes: Negative for visual disturbances Respiratory: Negative for shortness of breath, dyspnea Cardiovascular: Negative for chest pain  Gastrointestinal: Negative for vomiting, diarrhea and constipation Genitourinary: Negative  for dysuria and urgency Musculoskeletal: Negative for back pain, joint pain, myalgias.  Normal ROM  Neurological: Negative for dizziness and headaches    Physical Exam  Blood pressure 125/71, pulse 75, temperature 98.6 F (37 C), temperature source Oral, resp. rate 16, height 5\' 3"  (1.6 m), weight 66.5 kg, SpO2 100 %. GENERAL: Well-developed, well-nourished female in no acute distress.  LUNGS: Normal respiratory effort HEART: Regular rate and rhythm. ABDOMEN: Soft, nontender, no guarding or rebound EXTREMITIES: Nontender, no edema, 2+ distal pulses. DTR's 2+ PELVIC:  SSE:  Non-purulent white discharge w/o appreciable odor. Cx closed Labs: Results for orders placed or performed during the hospital encounter of 03/28/20 (from the past 24 hour(s))  Wet prep, genital   Collection Time: 03/28/20  4:35 PM   Specimen: Cervix  Result Value Ref Range   Yeast Wet Prep HPF POC NONE SEEN NONE SEEN   Trich, Wet Prep NONE SEEN NONE SEEN   Clue Cells Wet Prep HPF POC NONE SEEN NONE SEEN   WBC, Wet Prep HPF POC MODERATE (A) NONE SEEN   Sperm NONE SEEN    Imaging Studies:  No results found.  Assessment: Antwanette Kamoni Depree is  24 y.o. G1P0 at [redacted]w[redacted]d presents with 13 week demise, stable.  Plan: Discussed with Dr. [redacted]w[redacted]d.  Pt given precautions as to when to follow up ASAP; otherwise, call OB on Tuesday (tomorrow holiday) for [facility] date.   Joaquim Lai Cresenzo-Dishmon 5/30/20215:28 PM

## 2020-03-30 LAB — GC/CHLAMYDIA PROBE AMP (~~LOC~~) NOT AT ARMC
Chlamydia: NEGATIVE
Comment: NEGATIVE
Comment: NORMAL
Neisseria Gonorrhea: NEGATIVE

## 2023-06-12 ENCOUNTER — Encounter (HOSPITAL_BASED_OUTPATIENT_CLINIC_OR_DEPARTMENT_OTHER): Payer: Self-pay | Admitting: Emergency Medicine

## 2023-06-12 ENCOUNTER — Other Ambulatory Visit: Payer: Self-pay

## 2023-06-12 ENCOUNTER — Emergency Department (HOSPITAL_BASED_OUTPATIENT_CLINIC_OR_DEPARTMENT_OTHER)
Admission: EM | Admit: 2023-06-12 | Discharge: 2023-06-13 | Disposition: A | Discharge status: Home / Self Care | Payer: Medicaid Other | Attending: Emergency Medicine | Admitting: Emergency Medicine

## 2023-06-12 DIAGNOSIS — N921 Excessive and frequent menstruation with irregular cycle: Secondary | ICD-10-CM | POA: Insufficient documentation

## 2023-06-12 DIAGNOSIS — N939 Abnormal uterine and vaginal bleeding, unspecified: Secondary | ICD-10-CM | POA: Diagnosis present

## 2023-06-12 HISTORY — DX: Antiphospholipid syndrome: D68.61

## 2023-06-12 LAB — COMPREHENSIVE METABOLIC PANEL
ALT: 13 U/L (ref 0–44)
AST: 15 U/L (ref 15–41)
Albumin: 4.4 g/dL (ref 3.5–5.0)
Alkaline Phosphatase: 46 U/L (ref 38–126)
Anion gap: 9 (ref 5–15)
BUN: 20 mg/dL (ref 6–20)
CO2: 23 mmol/L (ref 22–32)
Calcium: 9.4 mg/dL (ref 8.9–10.3)
Chloride: 105 mmol/L (ref 98–111)
Creatinine, Ser: 0.89 mg/dL (ref 0.44–1.00)
GFR, Estimated: >60 mL/min (ref >60)
Glucose, Bld: 75 mg/dL (ref 70–99)
Potassium: 3.5 mmol/L (ref 3.5–5.1)
Sodium: 137 mmol/L (ref 135–145)
Total Bilirubin: 0.5 mg/dL (ref 0.3–1.2)
Total Protein: 8 g/dL (ref 6.5–8.1)

## 2023-06-12 LAB — CBC WITH DIFFERENTIAL/PLATELET
Abs Immature Granulocytes: 0.01 10*3/uL (ref 0.00–0.07)
Basophils Absolute: 0 10*3/uL (ref 0.0–0.1)
Basophils Relative: 1 %
Eosinophils Absolute: 0 10*3/uL (ref 0.0–0.5)
Eosinophils Relative: 1 %
HCT: 34.4 % — ABNORMAL LOW (ref 36.0–46.0)
Hemoglobin: 11.2 g/dL — ABNORMAL LOW (ref 12.0–15.0)
Immature Granulocytes: 0 %
Lymphocytes Relative: 46 %
Lymphs Abs: 3 10*3/uL (ref 0.7–4.0)
MCH: 27.5 pg (ref 26.0–34.0)
MCHC: 32.6 g/dL (ref 30.0–36.0)
MCV: 84.3 fL (ref 80.0–100.0)
Monocytes Absolute: 0.4 10*3/uL (ref 0.1–1.0)
Monocytes Relative: 6 %
Neutro Abs: 2.9 10*3/uL (ref 1.7–7.7)
Neutrophils Relative %: 46 %
Platelets: 222 10*3/uL (ref 150–400)
RBC: 4.08 MIL/uL (ref 3.87–5.11)
RDW: 12.5 % (ref 11.5–15.5)
WBC: 6.3 10*3/uL (ref 4.0–10.5)
nRBC: 0 % (ref 0.0–0.2)

## 2023-06-12 MED ORDER — SODIUM CHLORIDE 0.9 % IV BOLUS
1000.0000 mL | Freq: Once | INTRAVENOUS | Status: AC
  Administered 2023-06-12: 1000 mL via INTRAVENOUS

## 2023-06-12 NOTE — ED Triage Notes (Signed)
PT reports she has been on menstrual period for a month. GYN put her on new BCP 2 weeks ago that was supposed to improve it. She states no improvement in symptom. Was told to take ibuprofen for 5 days to help. States passing blood clots and feeling tired and dizzy.

## 2023-06-12 NOTE — ED Notes (Signed)
Pt. Reports she has had her BC changed x 2 since July due to continuous bleeding every day.  Heavy bleeding with a lot of blood clots.  Pt. Goes to a [facility].  Pt. Gave birth in Jan.2024.

## 2023-06-12 NOTE — ED Provider Notes (Signed)
  [place] EMERGENCY DEPARTMENT AT [facility] [place] Provider Note   CSN: [id number] Arrival date & time: 06/12/23  2053     History {Add pertinent medical, surgical, social history, OB history to HPI:1} Chief Complaint  Patient presents with   Vaginal Bleeding    Terri Rice is a 27 y.o. female. Patient with depo shot and then bleeding started. Started oral birth control x2 weeks. Patient using diapers for bleeding. 7 diapers today. Pelvic exam 1.5 weeks ago. Patient declining pelvic exam.  Feeling better with fluids. Ibuprofen has not been helping.  Denies fever, chest pain, dyspnea, abdominal pain, cough, nausea, vomiting, diarrhea, dysuria, hematuria.   Vaginal Bleeding      Home Medications Prior to Admission medications   Medication Sig Start Date End Date Taking? Authorizing Provider  folic acid (FOLVITE) 1 MG tablet Take 1 mg by mouth daily.    [provider]  Prenatal Vit-Fe Fumarate-FA (PRENATAL MULTIVITAMIN) TABS tablet Take 1 tablet by mouth daily at 12 noon.    [provider]  UNKNOWN TO PATIENT     [provider]      Allergies    Patient has no known allergies.    Review of Systems   Review of Systems  Genitourinary:  Positive for vaginal bleeding.    Physical Exam Updated Vital Signs BP (!) 137/103 (BP Location: Left Arm)   Pulse 70   Temp 97.7 F (36.5 C)   Resp 20   SpO2 100%  Physical Exam  ED Results / Procedures / Treatments   Labs (all labs ordered are listed, but only abnormal results are displayed) Labs Reviewed  CBC WITH DIFFERENTIAL/PLATELET - Abnormal; Notable for the following components:      Result Value   Hemoglobin 11.2 (*)    HCT 34.4 (*)    All other components within normal limits  COMPREHENSIVE METABOLIC PANEL    EKG None  Radiology No results found.  Procedures Procedures  {Document cardiac monitor, telemetry assessment procedure when appropriate:1}  Medications  Ordered in ED Medications  sodium chloride 0.9 % bolus 1,000 mL (1,000 mLs Intravenous New Bag/Given 06/12/23 2255)    ED Course/ Medical Decision Making/ A&P   {   Click here for ABCD2, HEART and other calculatorsREFRESH Note before signing :1}                              Medical Decision Making Amount and/or Complexity of Data Reviewed Labs: ordered.   ***  {Document critical care time when appropriate:1} {Document review of labs and clinical decision tools ie heart score, Chads2Vasc2 etc:1}  {Document your independent review of radiology images, and any outside records:1} {Document your discussion with family members, caretakers, and with consultants:1} {Document social determinants of health affecting pt's care:1} {Document your decision making why or why not admission, treatments were needed:1} Final Clinical Impression(s) / ED Diagnoses Final diagnoses:  None    Rx / DC Orders ED Discharge Orders     None

## 2023-06-13 NOTE — ED Notes (Signed)
Pt pulse ox while ambulating dropped to 94%, heart rate got to max 76.

## 2023-06-13 NOTE — Discharge Instructions (Addendum)
It was a pleasure caring for you today.  Blood workup showed that you are mildly anemic.  I recommend staying hydrated and following up with your gynecologist.  Seek emergency care if experiencing any new or worsening symptoms.

## 2023-07-17 ENCOUNTER — Ambulatory Visit: Payer: Medicaid Other | Admitting: Medical

## 2023-07-30 ENCOUNTER — Ambulatory Visit: Payer: Medicaid Other | Admitting: Medical
